# Patient Record
Sex: Female | Born: 1975 | Race: Black or African American | Hispanic: No | Marital: Married | Smoking: Never smoker
Health system: Southern US, Community
[De-identification: ages and names within clinical notes are randomized; demographics above are authoritative.]

## PROBLEM LIST (undated history)

## (undated) DIAGNOSIS — R002 Palpitations: Secondary | ICD-10-CM

## (undated) HISTORY — DX: Palpitations: R00.2

---

## 2002-06-08 ENCOUNTER — Emergency Department (HOSPITAL_COMMUNITY): Admission: EM | Admit: 2002-06-08 | Discharge: 2002-06-08 | Payer: Self-pay | Admitting: Emergency Medicine

## 2002-06-08 ENCOUNTER — Encounter: Payer: Self-pay | Admitting: Emergency Medicine

## 2002-08-10 ENCOUNTER — Emergency Department (HOSPITAL_COMMUNITY): Admission: EM | Admit: 2002-08-10 | Discharge: 2002-08-10 | Payer: Self-pay | Admitting: Emergency Medicine

## 2004-09-15 ENCOUNTER — Emergency Department (HOSPITAL_COMMUNITY): Admission: EM | Admit: 2004-09-15 | Discharge: 2004-09-15 | Payer: Self-pay | Admitting: Emergency Medicine

## 2005-04-01 ENCOUNTER — Ambulatory Visit (HOSPITAL_COMMUNITY): Admission: RE | Admit: 2005-04-01 | Discharge: 2005-04-01 | Payer: Self-pay | Admitting: Obstetrics and Gynecology

## 2005-07-18 ENCOUNTER — Ambulatory Visit (HOSPITAL_COMMUNITY): Admission: RE | Admit: 2005-07-18 | Discharge: 2005-07-18 | Payer: Self-pay | Admitting: Obstetrics and Gynecology

## 2009-09-26 ENCOUNTER — Emergency Department (HOSPITAL_COMMUNITY): Admission: EM | Admit: 2009-09-26 | Discharge: 2009-09-26 | Payer: Self-pay | Admitting: Emergency Medicine
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
## 2022-12-15 ENCOUNTER — Other Ambulatory Visit: Payer: Self-pay

## 2022-12-15 ENCOUNTER — Encounter: Payer: Self-pay | Admitting: Internal Medicine

## 2022-12-15 ENCOUNTER — Ambulatory Visit: Payer: Self-pay | Admitting: Internal Medicine

## 2022-12-15 VITALS — BP 114/67 | HR 70 | Temp 98.0°F | Ht 68.0 in | Wt 219.4 lb

## 2022-12-15 DIAGNOSIS — G8929 Other chronic pain: Secondary | ICD-10-CM

## 2022-12-15 DIAGNOSIS — R002 Palpitations: Secondary | ICD-10-CM

## 2022-12-15 DIAGNOSIS — M545 Low back pain, unspecified: Secondary | ICD-10-CM

## 2022-12-15 NOTE — Patient Instructions (Addendum)
Thank you, Ms.Sigurd for allowing [place] to provide your care today.   Palpitations Today we will check electrolytes  Please complete orange card paperwork. In the future we can check cholesterol and update pap smear for cervical cancer screening.  For your back, Please try walking several times a week as well as stretching. If this gets worse please come back to clinic to be re-evaluated.  I have ordered the following labs for you:  Lab Orders         BMP8+Anion Gap         Magnesium       Referrals ordered today:   Referral Orders  No referral(s) requested today     I have ordered the following medication/changed the following medications:   Stop the following medications: There are no discontinued medications.   Start the following medications: No orders of the defined types were placed in this encounter.    Follow up:  4 weeks    We look forward to seeing you next time. Please call our clinic at [phone] if you have any questions or concerns. The best time to call is Monday-Friday from 9am-4pm, but there is someone available 24/7. If after hours or the weekend, call the main hospital number and ask for the Internal Medicine Resident On-Call. If you need medication refills, please notify your pharmacy one week in advance and they will send [place] a request.   Thank you for trusting me with your care. Wishing you the best!   Christiana Fuchs, [place]

## 2022-12-16 LAB — BMP8+ANION GAP
Anion Gap: 16 mmol/L (ref 10.0–18.0)
BUN/Creatinine Ratio: 12 (ref 9–23)
BUN: 8 mg/dL (ref 6–24)
CO2: 18 mmol/L — ABNORMAL LOW (ref 20–29)
Calcium: 9.2 mg/dL (ref 8.7–10.2)
Chloride: 104 mmol/L (ref 96–106)
Creatinine, Ser: 0.65 mg/dL (ref 0.57–1.00)
Glucose: 71 mg/dL (ref 70–99)
Potassium: 4.3 mmol/L (ref 3.5–5.2)
Sodium: 138 mmol/L (ref 134–144)
eGFR: 109 mL/min/{1.73_m2} (ref >59)

## 2022-12-16 LAB — MAGNESIUM: Magnesium: 2.2 mg/dL (ref 1.6–2.3)

## 2022-12-17 ENCOUNTER — Encounter: Payer: Self-pay | Admitting: Internal Medicine

## 2022-12-17 DIAGNOSIS — M545 Low back pain, unspecified: Secondary | ICD-10-CM | POA: Insufficient documentation

## 2022-12-17 DIAGNOSIS — R002 Palpitations: Secondary | ICD-10-CM | POA: Insufficient documentation

## 2022-12-17 NOTE — Progress Notes (Signed)
Subjective:  CC: establish care  HPI:  Ms.Debra Howe is a 47 y.o. female with a past medical history stated below and presents today to establish care.  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  Her main health concern is palpitations that she has had.  These have happened to her for several years and have been in short bursts lasting seconds and she was largely unbothered by this.  About a month ago she had an episode that lasted 30 minutes and she felt fatigued following it.  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  She did not have any chest pain or shortness of breath during event, but following event she had some chest soreness. The other symptoms she wanted to talk about was some back pain that has been present for the last year and a half.  This is not present daily but is worst whenever she is worked a lot.  The pain does not radiate down her legs.  She has no saddle anesthesia or urinary incontinence. She has tried some stretching with improvement.  She does not take Tylenol regularly for this but does on occasion when it is bad with relief.  Please see problem based assessment and plan for additional details.  Past Medical History:  Diagnosis Date   Palpitations     No current outpatient medications on file prior to visit.   No current facility-administered medications on file prior to visit.    Family History  Problem Relation Age of Onset   Diabetes Father     Social History   Socioeconomic History   Marital status: Married    Spouse name: Not on file   Number of children: Not on file   Years of education: Not on file   Highest education level: Not on file  Occupational History   Not on file  Tobacco Use   Smoking status: Never   Smokeless tobacco: Never  Substance and Sexual Activity   Alcohol use: Never   Drug use: Never   Sexual activity: Not Currently  Other Topics Concern   Not on file  Social  History Narrative   Not on file   Social Determinants of Health   Financial Resource Strain: Medium Risk (12/15/2022)   Overall Financial Resource Strain (CARDIA)    Difficulty of Paying Living Expenses: Somewhat hard  Food Insecurity: Food Insecurity Present (12/15/2022)   Hunger Vital Sign    Worried About [place] in the Last Year: Sometimes true    Ran Out of Food in the Last Year: Sometimes true  Transportation Needs: No Transportation Needs (12/15/2022)   PRAPARE - Hydrologist (Medical): No    Lack of Transportation (Non-Medical): No  Physical Activity: Sufficiently Active (12/15/2022)   Exercise Vital Sign    Days of Exercise per Week: 7 days    Minutes of Exercise per Session: 100 min  Stress: No Stress Concern Present (12/15/2022)   [place]    Feeling of Stress : Not at all  Social Connections: Moderately Integrated (12/15/2022)   Social Connection and Isolation Panel [NHANES]    Frequency of Communication with Friends and Family: Three times a week    Frequency of Social Gatherings with Friends and Family: Never    Attends Religious Services: More than 4 times per year    Active Member of [employer] or Organizations:  Yes    Attends Club or Organization Meetings: More than 4 times per year    Marital Status: Divorced  Intimate Partner Violence: Not At Risk (12/15/2022)   Humiliation, Afraid, Rape, and Kick questionnaire    Fear of Current or Ex-Partner: No    Emotionally Abused: No    Physically Abused: No    Sexually Abused: No    Review of Systems: ROS negative except for what is noted on the assessment and plan.  Objective:   Vitals:   12/15/22 1532  BP: 114/67  Pulse: 70  Temp: 98 F (36.7 C)  TempSrc: Oral  SpO2: 100%  Weight: 219 lb 6.4 oz (99.5 kg)  Height: '5\' 8"'$  (1.727 m)    Physical Exam: Constitutional: well-appearing  Cardiovascular: regular  rate and rhythm, no m/r/g Pulmonary/Chest: normal work of breathing on room air, lungs clear to auscultation bilaterally MSK: Normal gait, no tenderness to palpation over lumbar vertebrae or paraspinal muscles, full flexion and extension of lumbar vertebrae, straight leg raise test negative bilaterally Skin: warm and dry   Assessment & Plan:  Low back pain She has low back pain is dull and intermittent.  She denies radiation of symptoms into her legs.  No red flag symptoms with saddle anesthesia or urinary incontinence.  Her physical exam was benign Her pain sounds consistent with a muscle strain.  She does not get regular exercise outside of work. P: I talked with her about trying to walk several times weekly to increase her activity.  We also talked through several stretches that she could try nightly to see if this helped with her back pain.  Palpitations She had 30 minutes of heart palpitations about 4 weeks ago.  He did not have chest pain or shortness of breath during event but felt very fatigued following this.   Differentials include PAC versus PVC versus atrial fibrillation.   P: She was okay with going ahead and getting BMP and mag.  These were both within normal limits. She does not currently have insurance and would like to wait to apply to orange card prior to additional work-up. -Follow-up following orange card paperwork completion.  Would get TSH and heart monitor at that time -return precautions given if another episode happens    Patient discussed with Dr. Wallace Cullens Mohammedali Bedoy, D.O. [facility]  PGY-[id number] Pager: [phone]  Phone: [phone] Date 12/17/2022  Time 6:41 PM

## 2022-12-17 NOTE — Assessment & Plan Note (Signed)
She had 30 minutes of heart palpitations about 4 weeks ago.  He did not have chest pain or shortness of breath during event but felt very fatigued following this.   Differentials include PAC versus PVC versus atrial fibrillation.   P: She was okay with going ahead and getting BMP and mag.  These were both within normal limits. She does not currently have insurance and would like to wait to apply to orange card prior to additional work-up. -Follow-up following orange card paperwork completion.  Would get TSH and heart monitor at that time -return precautions given if another episode happens

## 2022-12-17 NOTE — Assessment & Plan Note (Signed)
She has low back pain is dull and intermittent.  She denies radiation of symptoms into her legs.  No red flag symptoms with saddle anesthesia or urinary incontinence.  Her physical exam was benign Her pain sounds consistent with a muscle strain.  She does not get regular exercise outside of work. P: I talked with her about trying to walk several times weekly to increase her activity.  We also talked through several stretches that she could try nightly to see if this helped with her back pain.

## 2022-12-20 NOTE — Progress Notes (Signed)
Internal Medicine Clinic Attending  Case discussed with Dr. Masters  At the time of the visit.  We reviewed the resident's history and exam and pertinent patient test results.  I agree with the assessment, diagnosis, and plan of care documented in the resident's note.
